# Patient Record
Sex: Male | Born: 1965 | Race: White | Hispanic: No | Marital: Married | Smoking: Never smoker
Health system: Southern US, Community
[De-identification: ages and names within clinical notes are randomized; demographics above are authoritative.]

## PROBLEM LIST (undated history)

## (undated) DIAGNOSIS — B159 Hepatitis A without hepatic coma: Secondary | ICD-10-CM

## (undated) DIAGNOSIS — M419 Scoliosis, unspecified: Secondary | ICD-10-CM

## (undated) DIAGNOSIS — T7840XA Allergy, unspecified, initial encounter: Secondary | ICD-10-CM

## (undated) DIAGNOSIS — F32A Depression, unspecified: Secondary | ICD-10-CM

## (undated) HISTORY — DX: Allergy, unspecified, initial encounter: T78.40XA

## (undated) HISTORY — DX: Depression, unspecified: F32.A

## (undated) HISTORY — DX: Hepatitis a without hepatic coma: B15.9

## (undated) HISTORY — PX: HERNIA REPAIR: SHX51

## (undated) HISTORY — DX: Scoliosis, unspecified: M41.9

---

## 2020-03-13 LAB — BASIC METABOLIC PANEL
BUN: 12 (ref 4–21)
CO2: 23 — AB (ref 13–22)
Chloride: 104 (ref 99–108)
Creatinine: 1 (ref 0.6–1.3)
Glucose: 105
Potassium: 3.8 (ref 3.4–5.3)
Sodium: 140 (ref 137–147)

## 2020-03-13 LAB — CBC AND DIFFERENTIAL
HCT: 42 (ref 41–53)
Hemoglobin: 14 (ref 13.5–17.5)
Platelets: 212 (ref 150–399)
WBC: 4

## 2020-03-13 LAB — LIPID PANEL
Cholesterol: 199 (ref 0–200)
HDL: 56 (ref 35–70)
LDL Cholesterol: 111
Triglycerides: 182 — AB (ref 40–160)

## 2020-03-13 LAB — HEPATIC FUNCTION PANEL
ALT: 20 (ref 10–40)
AST: 23 (ref 14–40)
Alkaline Phosphatase: 76 (ref 25–125)
Bilirubin, Total: 0.4

## 2020-03-13 LAB — COMPREHENSIVE METABOLIC PANEL
Albumin: 4.5 (ref 3.5–5.0)
Calcium: 9.4 (ref 8.7–10.7)

## 2020-07-12 ENCOUNTER — Other Ambulatory Visit (HOSPITAL_COMMUNITY): Payer: Self-pay | Admitting: Family Medicine

## 2021-03-09 ENCOUNTER — Ambulatory Visit: Payer: 59 | Attending: Internal Medicine

## 2021-03-09 ENCOUNTER — Other Ambulatory Visit (HOSPITAL_BASED_OUTPATIENT_CLINIC_OR_DEPARTMENT_OTHER): Payer: Self-pay

## 2021-03-09 ENCOUNTER — Other Ambulatory Visit: Payer: Self-pay

## 2021-03-09 DIAGNOSIS — Z23 Encounter for immunization: Secondary | ICD-10-CM

## 2021-03-09 MED ORDER — MODERNA COVID-19 VACCINE 100 MCG/0.5ML IM SUSP
INTRAMUSCULAR | 0 refills | Status: DC
  Filled 2021-03-09: qty 0.25, 1d supply, fill #0

## 2021-03-09 NOTE — Progress Notes (Signed)
   Covid-19 Vaccination Clinic  Name:  Jason Vincent    MRN: [id number] DOB: 05/20/1966  03/09/2021  Jason Vincent was observed post Covid-19 immunization for 15 minutes without incident. She was provided with Vaccine Information Sheet and instruction to access the V-Safe system.   Jason Vincent was instructed to call 911 with any severe reactions post vaccine: Marland Kitchen Difficulty breathing  . Swelling of face and throat  . A fast heartbeat  . A bad rash all over body  . Dizziness and weakness   Immunizations Administered    Name Date Dose VIS Date Route   Moderna Covid-19 Booster Vaccine 03/09/2021  1:18 PM 0.25 mL 08/16/2020 Intramuscular   Manufacturer: Moderna   Lot: [id number]   NDC: 62947-654-65

## 2021-05-08 ENCOUNTER — Ambulatory Visit: Payer: 59 | Admitting: Internal Medicine

## 2021-05-08 ENCOUNTER — Other Ambulatory Visit: Payer: Self-pay

## 2021-05-08 ENCOUNTER — Encounter: Payer: Self-pay | Admitting: Internal Medicine

## 2021-05-08 ENCOUNTER — Ambulatory Visit (INDEPENDENT_AMBULATORY_CARE_PROVIDER_SITE_OTHER): Payer: 59

## 2021-05-08 VITALS — BP 136/74 | HR 75 | Temp 98.7°F | Resp 16 | Ht 68.0 in | Wt 159.0 lb

## 2021-05-08 DIAGNOSIS — M4802 Spinal stenosis, cervical region: Secondary | ICD-10-CM | POA: Insufficient documentation

## 2021-05-08 DIAGNOSIS — G992 Myelopathy in diseases classified elsewhere: Secondary | ICD-10-CM | POA: Diagnosis not present

## 2021-05-08 DIAGNOSIS — M25551 Pain in right hip: Secondary | ICD-10-CM

## 2021-05-08 DIAGNOSIS — G8929 Other chronic pain: Secondary | ICD-10-CM

## 2021-05-08 NOTE — Patient Instructions (Signed)
Hip Pain The hip is the joint between the upper legs and the lower pelvis. The bones, cartilage, tendons, and muscles of your hip joint support your body and allow you to move around. Hip pain can range from a minor ache to severe pain in one or both of your hips. The pain may be felt on the inside of the hip joint near the groin, or on the outside near the buttocks and upper thigh. You may also have swelling or stiffness in your hip area. Follow these instructions at home: Managing pain, stiffness, and swelling   If directed, put ice on the painful area. To do this: Put ice in a plastic bag. Place a towel between your skin and the bag. Leave the ice on for 20 minutes, 2-3 times a day. If directed, apply heat to the affected area as often as told by your health care provider. Use the heat source that your health care provider recommends, such as a moist heat pack or a heating pad. Place a towel between your skin and the heat source. Leave the heat on for 20-30 minutes. Remove the heat if your skin turns bright red. This is especially important if you are unable to feel pain, heat, or cold. You may have a greater risk of getting burned. Activity Do exercises as told by your health care provider. Avoid activities that cause pain. General instructions  Take over-the-counter and prescription medicines only as told by your health care provider. Keep a journal of your symptoms. Write down: How often you have hip pain. The location of your pain. What the pain feels like. What makes the pain worse. Sleep with a pillow between your legs on your most comfortable side. Keep all follow-up visits as told by your health care provider. This is important. Contact a health care provider if: You cannot put weight on your leg. Your pain or swelling continues or gets worse after one week. It gets harder to walk. You have a fever. Get help right away if: You fall. You have a sudden increase in pain and  swelling in your hip. Your hip is red or swollen or very tender to touch. Summary Hip pain can range from a minor ache to severe pain in one or both of your hips. The pain may be felt on the inside of the hip joint near the groin, or on the outside near the buttocks and upper thigh. Avoid activities that cause pain. Write down how often you have hip pain, the location of the pain, what makes it worse, and what it feels like. This information is not intended to replace advice given to you by your health care provider. Make sure you discuss any questions you have with your health care provider. Document Revised: 03/01/2019 Document Reviewed: 03/01/2019 Elsevier Patient Education  2022 [employer]  

## 2021-05-08 NOTE — Progress Notes (Signed)
Subjective:  Patient ID: Jason Vincent, male    DOB: 06/02/1966  Age: 55 y.o. MRN: [id number]  CC: Neck Pain  This visit occurred during the SARS-CoV-2 public health emergency.  Safety protocols were in place, including screening questions prior to the visit, additional usage of staff PPE, and extensive cleaning of exam room while observing appropriate contact time as indicated for disinfecting solutions.    HPI Jason Vincent presents for establishing.  He complains of chronic worsening neck pain.  He has a known history of cervical spinal stenosis.  The pain radiates into his right upper extremity and he has tingling in his right hand.  He is getting symptom relief with baclofen, Tylenol, and Motrin.  He denies any recent trauma or injury.  He also complains of a several week history of pain in his right posterior hip. He is a [employer].  History Jason Vincent has a past medical history of Allergies, Depression, Hepatitis A, and Scoliosis.   He has a past surgical history that includes Hernia repair.   His family history includes Alcohol abuse in his paternal grandfather; Arthritis in his father, mother, and paternal grandmother; Birth defects in his brother; COPD in his maternal grandfather; Cancer in his maternal grandmother and mother; Depression in his father, mother, and paternal grandmother; Early death in his paternal grandfather; Hearing loss in his father and paternal grandmother; Heart disease in his maternal grandfather and paternal grandfather; Hyperlipidemia in his mother; Hypertension in his father and mother; Mental illness in his mother.He reports that he has never smoked. He has never used smokeless tobacco. He reports current alcohol use of about 21.0 standard drinks of alcohol per week. He reports that he does not use drugs.  Outpatient Medications Prior to Visit  Medication Sig Dispense Refill   B Complex Vitamins (B COMPLEX PO) Take by mouth.     baclofen  (LIORESAL) 10 MG tablet TAKE 1/2 TABLET BY MOUTH THREE TIMES DAILY AS NEEDED 30 tablet 0   Cyanocobalamin (VITAMIN B-12 PO) Take by mouth.     magnesium 30 MG tablet Take 30 mg by mouth 2 (two) times daily.     Multiple Vitamin (MULTIVITAMIN) tablet Take 1 tablet by mouth daily.     VITAMIN D PO Take by mouth.     No facility-administered medications prior to visit.    ROS Review of Systems  Constitutional: Negative.  Negative for diaphoresis and fatigue.  HENT: Negative.    Eyes: Negative.   Respiratory:  Negative for cough, chest tightness, shortness of breath and wheezing.   Cardiovascular:  Negative for chest pain, palpitations and leg swelling.  Gastrointestinal:  Negative for abdominal pain, constipation, diarrhea, nausea and vomiting.  Endocrine: Negative.   Genitourinary: Negative.  Negative for difficulty urinating.  Musculoskeletal:  Positive for arthralgias and neck pain. Negative for back pain, joint swelling and myalgias.  Skin: Negative.  Negative for color change and pallor.  Neurological:  Positive for weakness. Negative for dizziness and numbness.  Hematological:  Negative for adenopathy. Does not bruise/bleed easily.  Psychiatric/Behavioral: Negative.     Objective:  BP 136/74 (BP Location: Left Arm, Patient Position: Sitting, Cuff Size: Large)   Pulse 75   Temp 98.7 F (37.1 C) (Oral)   Resp 16   Ht 5\' 8"  (1.727 m)   Wt 159 lb (72.1 kg)   SpO2 96%   BMI 24.18 kg/m   Physical Exam Vitals reviewed.  Constitutional:      Appearance:  Normal appearance.  HENT:     Nose: Nose normal.     Mouth/Throat:     Mouth: Mucous membranes are moist.  Eyes:     Conjunctiva/sclera: Conjunctivae normal.  Cardiovascular:     Rate and Rhythm: Normal rate and regular rhythm.     Heart sounds: No murmur heard. Pulmonary:     Effort: Pulmonary effort is normal.     Breath sounds: No stridor. No wheezing, rhonchi or rales.  Abdominal:     General: Abdomen is flat.      Palpations: There is no mass.     Tenderness: There is no abdominal tenderness. There is no guarding.  Musculoskeletal:        General: Normal range of motion.     Cervical back: Neck supple. No edema, rigidity, spasms or tenderness.     Thoracic back: Normal.     Lumbar back: Normal.     Right hip: Tenderness present. No deformity or bony tenderness. Normal range of motion. Normal strength.     Left hip: Normal.     Right lower leg: Normal. No edema.     Left lower leg: Normal. No edema.     Comments: Ttp around the SI joint  Lymphadenopathy:     Cervical: No cervical adenopathy.  Skin:    General: Skin is warm and dry.  Neurological:     General: No focal deficit present.     Mental Status: He is alert.    Lab Results  Component Value Date   WBC 4.0 03/13/2020   HGB 14.0 03/13/2020   HCT 42 03/13/2020   PLT 212 03/13/2020   CHOL 199 03/13/2020   TRIG 182 (A) 03/13/2020   HDL 56 03/13/2020   LDLCALC 111 03/13/2020   ALT 20 03/13/2020   AST 23 03/13/2020   NA 140 03/13/2020   K 3.8 03/13/2020   CL 104 03/13/2020   CREATININE 1.0 03/13/2020   BUN 12 03/13/2020   CO2 23 (A) 03/13/2020     Narrative & Impression  CLINICAL DATA:  Chronic right hip pain   EXAM: DG HIP (WITH OR WITHOUT PELVIS) 2-3V RIGHT   COMPARISON:  None.   FINDINGS: There is no evidence of hip fracture or dislocation. There is no evidence of arthropathy or other focal bone abnormality.   IMPRESSION: Negative.     Electronically Signed   By: Jason Numbers MD   On: 05/10/2021 21:22    Assessment & Plan:   Jason Vincent was seen today for neck pain.  Diagnoses and all orders for this visit:  Chronic right hip pain- Based on the exam and xrays this is likely piriformis syndrome. 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 -     MR Cervical Spine Wo Contrast; Future  I have discontinued Jason Vincent's Moderna COVID-19 Vaccine. I am also having him maintain his baclofen, VITAMIN D PO, B Complex Vitamins (B COMPLEX PO), multivitamin, magnesium, and Cyanocobalamin (VITAMIN B-12 PO).  No orders of the defined types were placed in this encounter.    Follow-up: Return in about 3 months (around 08/08/2021).  Sanda Linger, MD

## 2021-05-14 ENCOUNTER — Encounter: Payer: Self-pay | Admitting: Internal Medicine

## 2021-05-21 ENCOUNTER — Other Ambulatory Visit: Payer: Self-pay | Admitting: Internal Medicine

## 2021-05-21 ENCOUNTER — Telehealth: Payer: Self-pay

## 2021-05-21 DIAGNOSIS — M4802 Spinal stenosis, cervical region: Secondary | ICD-10-CM

## 2021-05-22 ENCOUNTER — Other Ambulatory Visit: Payer: 59

## 2021-05-22 NOTE — Telephone Encounter (Signed)
Left message for patient to call me back. 

## 2021-08-20 ENCOUNTER — Other Ambulatory Visit (HOSPITAL_BASED_OUTPATIENT_CLINIC_OR_DEPARTMENT_OTHER): Payer: Self-pay

## 2021-08-20 MED ORDER — FLUARIX QUADRIVALENT 0.5 ML IM SUSY
PREFILLED_SYRINGE | INTRAMUSCULAR | 0 refills | Status: AC
  Filled 2021-08-20: qty 0.5, 1d supply, fill #0

## 2021-11-05 ENCOUNTER — Other Ambulatory Visit: Payer: Self-pay

## 2022-08-13 IMAGING — DX DG HIP (WITH OR WITHOUT PELVIS) 2-3V*R*
3 series · 3 of 3 positions shown · non-contrast
Comparison: None.

CLINICAL DATA: Chronic right hip pain

EXAM:
DG HIP (WITH OR WITHOUT PELVIS) 2-3V RIGHT

[pelvis ap]
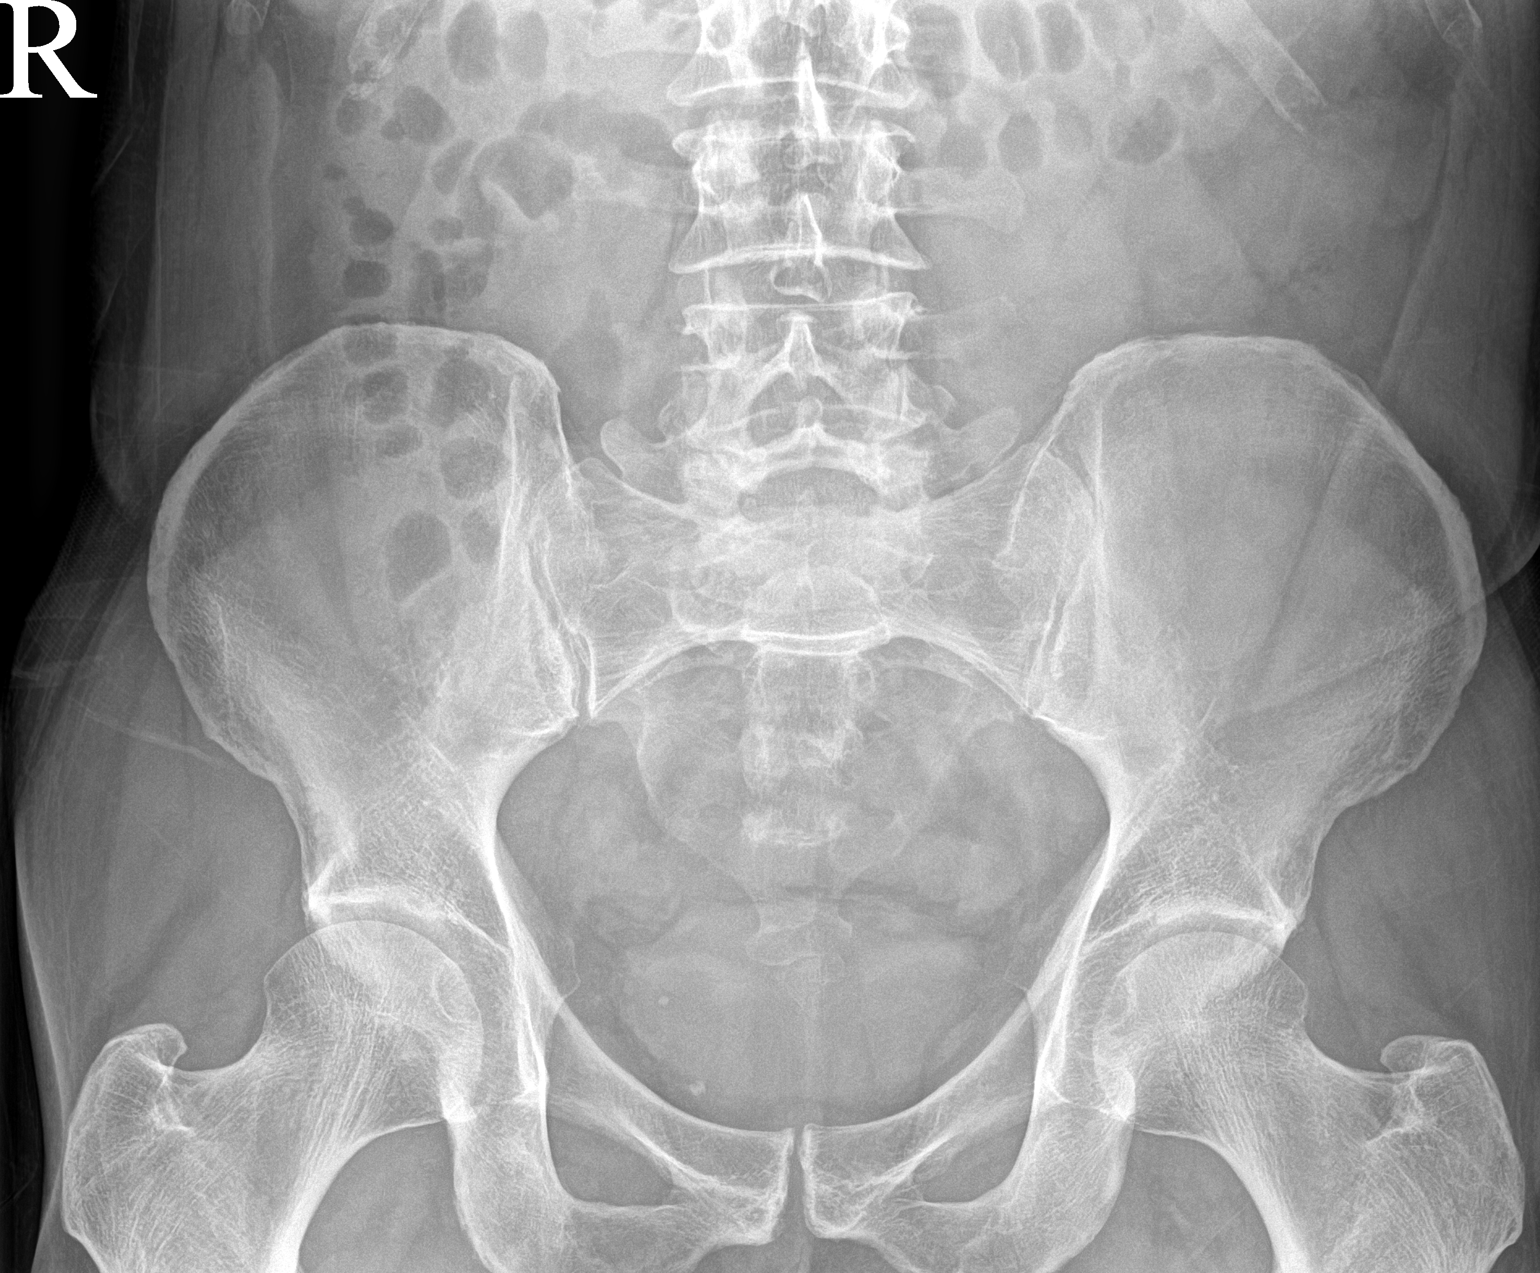

[hip ap (1 of 2)]
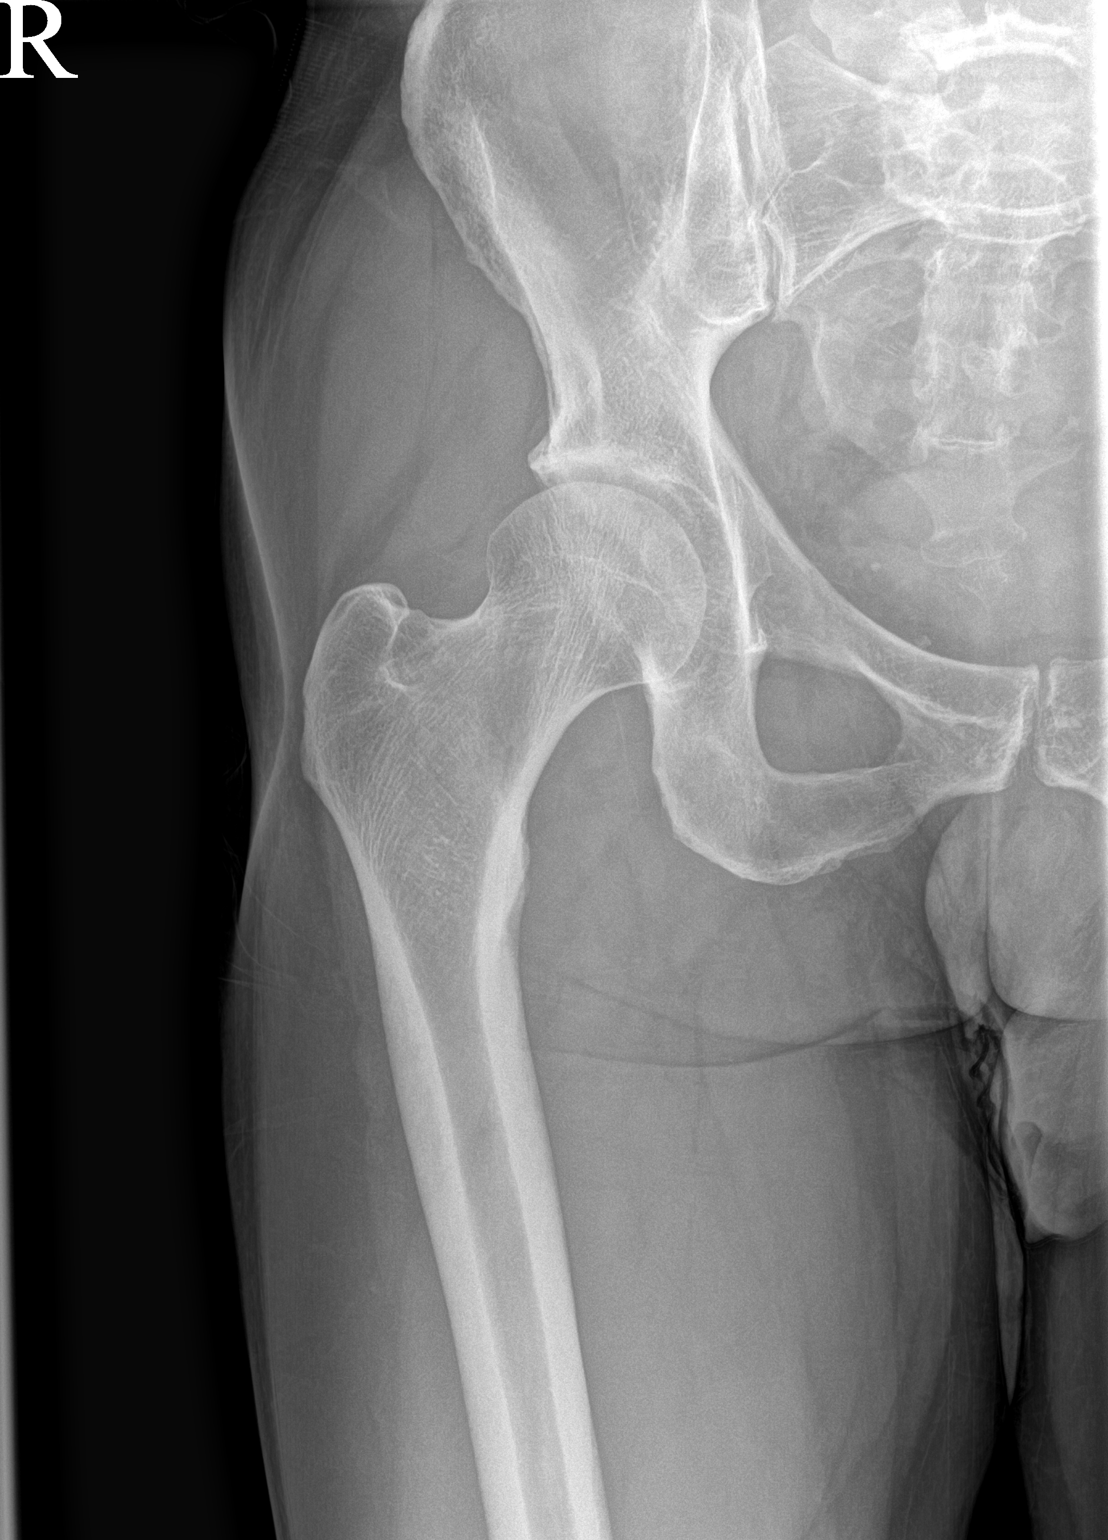

[hip ap (2 of 2)]
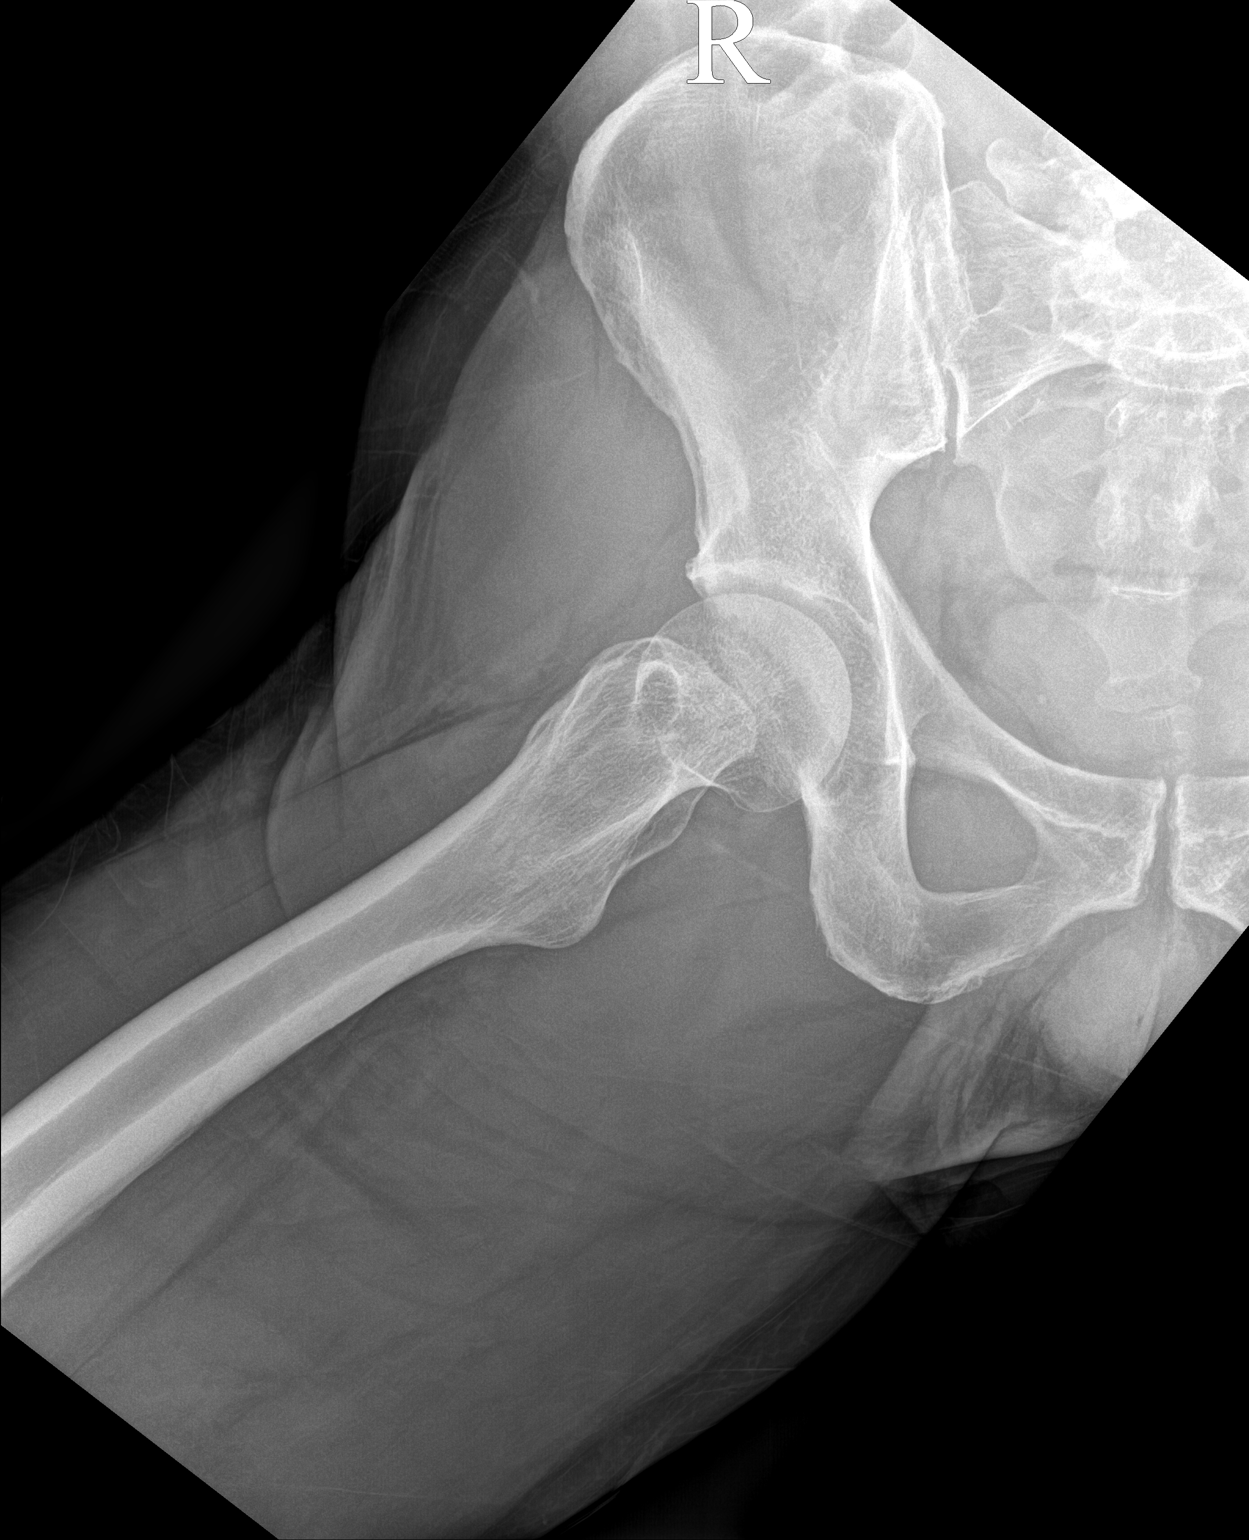

[3 of 3 positions shown; findings below may reference images not displayed]

FINDINGS: There is no evidence of hip fracture or dislocation. There is no
evidence of arthropathy or other focal bone abnormality.
IMPRESSION: Negative.

## 2022-08-16 ENCOUNTER — Other Ambulatory Visit (HOSPITAL_BASED_OUTPATIENT_CLINIC_OR_DEPARTMENT_OTHER): Payer: Self-pay

## 2022-08-16 MED ORDER — INFLUENZA VAC SPLIT QUAD 0.5 ML IM SUSY
PREFILLED_SYRINGE | INTRAMUSCULAR | 0 refills | Status: AC
  Filled 2022-08-16: qty 0.5, 1d supply, fill #0
